# Patient Record
Sex: Male | Born: 1985 | Race: Black or African American | Hispanic: No | Marital: Married | Smoking: Never smoker
Health system: Southern US, Community
[De-identification: ages and names within clinical notes are randomized; demographics above are authoritative.]

---

## 2011-10-30 ENCOUNTER — Emergency Department (HOSPITAL_COMMUNITY): Payer: Self-pay

## 2011-10-30 ENCOUNTER — Encounter (HOSPITAL_COMMUNITY): Payer: Self-pay

## 2011-10-30 ENCOUNTER — Emergency Department (INDEPENDENT_AMBULATORY_CARE_PROVIDER_SITE_OTHER): Payer: Self-pay

## 2011-10-30 ENCOUNTER — Emergency Department (INDEPENDENT_AMBULATORY_CARE_PROVIDER_SITE_OTHER)
Admission: EM | Admit: 2011-10-30 | Discharge: 2011-10-30 | Disposition: A | Discharge status: Home / Self Care | Payer: Self-pay | Source: Home / Self Care | Attending: Emergency Medicine | Admitting: Emergency Medicine

## 2011-10-30 DIAGNOSIS — S9030XA Contusion of unspecified foot, initial encounter: Secondary | ICD-10-CM

## 2011-10-30 DIAGNOSIS — S1093XA Contusion of unspecified part of neck, initial encounter: Secondary | ICD-10-CM

## 2011-10-30 DIAGNOSIS — S0033XA Contusion of nose, initial encounter: Secondary | ICD-10-CM

## 2011-10-30 DIAGNOSIS — S0003XA Contusion of scalp, initial encounter: Secondary | ICD-10-CM

## 2011-10-30 MED ORDER — MELOXICAM 7.5 MG PO TABS: 7.5000 mg | ORAL_TABLET | Freq: Every day | ORAL | Status: AC
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
## 2013-05-27 ENCOUNTER — Emergency Department (INDEPENDENT_AMBULATORY_CARE_PROVIDER_SITE_OTHER): Payer: Self-pay

## 2013-05-27 ENCOUNTER — Encounter (HOSPITAL_COMMUNITY): Payer: Self-pay

## 2013-05-27 ENCOUNTER — Emergency Department (INDEPENDENT_AMBULATORY_CARE_PROVIDER_SITE_OTHER)
Admission: EM | Admit: 2013-05-27 | Discharge: 2013-05-27 | Disposition: A | Discharge status: Home / Self Care | Payer: Self-pay | Source: Home / Self Care | Attending: Emergency Medicine | Admitting: Emergency Medicine

## 2013-05-27 DIAGNOSIS — S7002XA Contusion of left hip, initial encounter: Secondary | ICD-10-CM

## 2013-05-27 DIAGNOSIS — S7000XA Contusion of unspecified hip, initial encounter: Secondary | ICD-10-CM

## 2013-05-27 MED ORDER — HYDROCODONE-ACETAMINOPHEN 5-325 MG PO TABS
ORAL_TABLET | ORAL | Status: DC
Start: 2013-05-27 — End: 2017-08-30

## 2013-05-27 MED ORDER — NAPROXEN 500 MG PO TABS: 500.0000 mg | ORAL_TABLET | Freq: Two times a day (BID) | ORAL | Status: DC
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
## 2016-08-30 ENCOUNTER — Ambulatory Visit (INDEPENDENT_AMBULATORY_CARE_PROVIDER_SITE_OTHER): Payer: Commercial Managed Care - HMO | Admitting: Family Medicine

## 2016-08-30 ENCOUNTER — Encounter: Payer: Self-pay | Admitting: Family Medicine

## 2016-08-30 DIAGNOSIS — H6981 Other specified disorders of Eustachian tube, right ear: Secondary | ICD-10-CM | POA: Diagnosis not present

## 2016-08-30 DIAGNOSIS — H6980 Other specified disorders of Eustachian tube, unspecified ear: Secondary | ICD-10-CM | POA: Insufficient documentation

## 2016-08-30 MED ORDER — FLUTICASONE PROPIONATE 50 MCG/ACT NA SUSP: NASAL | 1 refills | Status: DC

## 2016-08-30 NOTE — Assessment & Plan Note (Signed)
We'll place him on Flonase for a month. For the next week I would recommend he get some over-the-counter decongestant such as Sudafed and take one or 2 a day. If this does not clear up in the next 2-3 weeks he eats let me know. We briefly discussed sequela such as glue ear. He'll call if not improving.

## 2016-08-30 NOTE — Progress Notes (Signed)
    CHIEF COMPLAINT / HPI:   Right ear discomfort. For 2-3 days she's had some ear popping and in the last 24 hours he's has sensation of the ear being stuffed up in sounds seeming like he is under water. During this last 24 hours he's not had the ability to pop his ear. Pretty uncomfortable. He's never had this before. He's never had problem with wax before. He's had no other unusual symptoms like congestion, no sore throat, no fever sweats or chills.  Nonsmoker  REVIEW OF SYSTEMS:  See history of present illness  OBJECTIVE:  Vital signs are reviewed.   GEN.: Well-developed male no acute distress HEENT: Initially the TM on the right was her sleep a lot by cerumen. Once that was removed, the eardrum was seen to be quite retracted read, there was no cone of light or other identifiable landmarks and it was nonmobile. The left TM looked normal. Oropharynx is clear without a sign of exudate. He has no lymphadenopathy in the neck. Nasal mucosa is slightly boggy.    ASSESSMENT / PLAN: Please see problem oriented charting for details

## 2017-08-30 ENCOUNTER — Ambulatory Visit (HOSPITAL_COMMUNITY)
Admission: EM | Admit: 2017-08-30 | Discharge: 2017-08-30 | Disposition: A | Discharge status: Home / Self Care | Payer: 59 | Attending: Family Medicine | Admitting: Family Medicine

## 2017-08-30 ENCOUNTER — Encounter (HOSPITAL_COMMUNITY): Payer: Self-pay | Admitting: Emergency Medicine

## 2017-08-30 DIAGNOSIS — S80211A Abrasion, right knee, initial encounter: Secondary | ICD-10-CM | POA: Diagnosis not present

## 2017-08-30 DIAGNOSIS — Z23 Encounter for immunization: Secondary | ICD-10-CM | POA: Diagnosis not present

## 2017-08-30 DIAGNOSIS — T07XXXA Unspecified multiple injuries, initial encounter: Secondary | ICD-10-CM | POA: Diagnosis not present

## 2017-08-30 DIAGNOSIS — S40211A Abrasion of right shoulder, initial encounter: Secondary | ICD-10-CM

## 2017-08-30 MED ORDER — MUPIROCIN 2 % EX OINT: 1.0000 "application " | TOPICAL_OINTMENT | Freq: Three times a day (TID) | CUTANEOUS | 3 refills | Status: DC

## 2017-08-30 MED ORDER — TETANUS-DIPHTH-ACELL PERTUSSIS 5-2.5-18.5 LF-MCG/0.5 IM SUSP
INTRAMUSCULAR | Status: AC
  Filled 2017-08-30: qty 0.5

## 2017-08-30 MED ORDER — TETANUS-DIPHTH-ACELL PERTUSSIS 5-2.5-18.5 LF-MCG/0.5 IM SUSP
0.5000 mL | Freq: Once | INTRAMUSCULAR | Status: AC
  Administered 2017-08-30: 0.5 mL via INTRAMUSCULAR
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
## 2017-08-30 NOTE — Discharge Instructions (Signed)
Gently wash the abrasions several times a day and apply the ointment to the wounds.

## 2018-05-02 DIAGNOSIS — M9902 Segmental and somatic dysfunction of thoracic region: Secondary | ICD-10-CM | POA: Diagnosis not present

## 2018-05-02 DIAGNOSIS — M546 Pain in thoracic spine: Secondary | ICD-10-CM | POA: Diagnosis not present

## 2018-05-02 DIAGNOSIS — M9903 Segmental and somatic dysfunction of lumbar region: Secondary | ICD-10-CM | POA: Diagnosis not present

## 2018-05-06 DIAGNOSIS — M62830 Muscle spasm of back: Secondary | ICD-10-CM | POA: Diagnosis not present

## 2018-05-06 DIAGNOSIS — M9902 Segmental and somatic dysfunction of thoracic region: Secondary | ICD-10-CM | POA: Diagnosis not present

## 2018-05-06 DIAGNOSIS — M546 Pain in thoracic spine: Secondary | ICD-10-CM | POA: Diagnosis not present

## 2018-05-06 DIAGNOSIS — M545 Low back pain: Secondary | ICD-10-CM | POA: Diagnosis not present

## 2018-05-06 DIAGNOSIS — M9903 Segmental and somatic dysfunction of lumbar region: Secondary | ICD-10-CM | POA: Diagnosis not present

## 2018-05-10 DIAGNOSIS — M545 Low back pain: Secondary | ICD-10-CM | POA: Diagnosis not present

## 2018-05-10 DIAGNOSIS — M546 Pain in thoracic spine: Secondary | ICD-10-CM | POA: Diagnosis not present

## 2018-05-10 DIAGNOSIS — M9902 Segmental and somatic dysfunction of thoracic region: Secondary | ICD-10-CM | POA: Diagnosis not present

## 2018-05-10 DIAGNOSIS — M9903 Segmental and somatic dysfunction of lumbar region: Secondary | ICD-10-CM | POA: Diagnosis not present

## 2018-05-10 DIAGNOSIS — M62830 Muscle spasm of back: Secondary | ICD-10-CM | POA: Diagnosis not present

## 2018-05-13 DIAGNOSIS — M546 Pain in thoracic spine: Secondary | ICD-10-CM | POA: Diagnosis not present

## 2018-05-13 DIAGNOSIS — M62830 Muscle spasm of back: Secondary | ICD-10-CM | POA: Diagnosis not present

## 2018-05-13 DIAGNOSIS — M9903 Segmental and somatic dysfunction of lumbar region: Secondary | ICD-10-CM | POA: Diagnosis not present

## 2018-05-13 DIAGNOSIS — M545 Low back pain: Secondary | ICD-10-CM | POA: Diagnosis not present

## 2018-05-13 DIAGNOSIS — M9902 Segmental and somatic dysfunction of thoracic region: Secondary | ICD-10-CM | POA: Diagnosis not present

## 2018-05-15 DIAGNOSIS — M545 Low back pain: Secondary | ICD-10-CM | POA: Diagnosis not present

## 2018-05-15 DIAGNOSIS — M546 Pain in thoracic spine: Secondary | ICD-10-CM | POA: Diagnosis not present

## 2018-05-15 DIAGNOSIS — M62830 Muscle spasm of back: Secondary | ICD-10-CM | POA: Diagnosis not present

## 2018-05-15 DIAGNOSIS — M9902 Segmental and somatic dysfunction of thoracic region: Secondary | ICD-10-CM | POA: Diagnosis not present

## 2018-05-15 DIAGNOSIS — M9903 Segmental and somatic dysfunction of lumbar region: Secondary | ICD-10-CM | POA: Diagnosis not present

## 2018-05-22 DIAGNOSIS — M546 Pain in thoracic spine: Secondary | ICD-10-CM | POA: Diagnosis not present

## 2018-05-22 DIAGNOSIS — M545 Low back pain: Secondary | ICD-10-CM | POA: Diagnosis not present

## 2018-05-22 DIAGNOSIS — M9902 Segmental and somatic dysfunction of thoracic region: Secondary | ICD-10-CM | POA: Diagnosis not present

## 2018-05-22 DIAGNOSIS — M9903 Segmental and somatic dysfunction of lumbar region: Secondary | ICD-10-CM | POA: Diagnosis not present

## 2018-05-22 DIAGNOSIS — M62830 Muscle spasm of back: Secondary | ICD-10-CM | POA: Diagnosis not present

## 2018-05-27 DIAGNOSIS — M546 Pain in thoracic spine: Secondary | ICD-10-CM | POA: Diagnosis not present

## 2018-05-27 DIAGNOSIS — M545 Low back pain: Secondary | ICD-10-CM | POA: Diagnosis not present

## 2018-05-27 DIAGNOSIS — M62830 Muscle spasm of back: Secondary | ICD-10-CM | POA: Diagnosis not present

## 2018-05-27 DIAGNOSIS — M9902 Segmental and somatic dysfunction of thoracic region: Secondary | ICD-10-CM | POA: Diagnosis not present

## 2018-05-27 DIAGNOSIS — M9903 Segmental and somatic dysfunction of lumbar region: Secondary | ICD-10-CM | POA: Diagnosis not present

## 2018-05-29 DIAGNOSIS — M9903 Segmental and somatic dysfunction of lumbar region: Secondary | ICD-10-CM | POA: Diagnosis not present

## 2018-05-29 DIAGNOSIS — M546 Pain in thoracic spine: Secondary | ICD-10-CM | POA: Diagnosis not present

## 2018-05-29 DIAGNOSIS — M9902 Segmental and somatic dysfunction of thoracic region: Secondary | ICD-10-CM | POA: Diagnosis not present

## 2018-05-29 DIAGNOSIS — M545 Low back pain: Secondary | ICD-10-CM | POA: Diagnosis not present

## 2018-05-29 DIAGNOSIS — M62830 Muscle spasm of back: Secondary | ICD-10-CM | POA: Diagnosis not present

## 2018-05-31 DIAGNOSIS — M9903 Segmental and somatic dysfunction of lumbar region: Secondary | ICD-10-CM | POA: Diagnosis not present

## 2018-05-31 DIAGNOSIS — M546 Pain in thoracic spine: Secondary | ICD-10-CM | POA: Diagnosis not present

## 2018-05-31 DIAGNOSIS — M9902 Segmental and somatic dysfunction of thoracic region: Secondary | ICD-10-CM | POA: Diagnosis not present

## 2018-05-31 DIAGNOSIS — M62830 Muscle spasm of back: Secondary | ICD-10-CM | POA: Diagnosis not present

## 2018-05-31 DIAGNOSIS — M545 Low back pain: Secondary | ICD-10-CM | POA: Diagnosis not present

## 2018-06-03 DIAGNOSIS — M62830 Muscle spasm of back: Secondary | ICD-10-CM | POA: Diagnosis not present

## 2018-06-03 DIAGNOSIS — M9903 Segmental and somatic dysfunction of lumbar region: Secondary | ICD-10-CM | POA: Diagnosis not present

## 2018-06-03 DIAGNOSIS — M9902 Segmental and somatic dysfunction of thoracic region: Secondary | ICD-10-CM | POA: Diagnosis not present

## 2018-06-03 DIAGNOSIS — M546 Pain in thoracic spine: Secondary | ICD-10-CM | POA: Diagnosis not present

## 2018-06-03 DIAGNOSIS — M545 Low back pain: Secondary | ICD-10-CM | POA: Diagnosis not present

## 2018-07-26 DIAGNOSIS — M9902 Segmental and somatic dysfunction of thoracic region: Secondary | ICD-10-CM | POA: Diagnosis not present

## 2018-07-26 DIAGNOSIS — M545 Low back pain: Secondary | ICD-10-CM | POA: Diagnosis not present

## 2018-07-26 DIAGNOSIS — M9903 Segmental and somatic dysfunction of lumbar region: Secondary | ICD-10-CM | POA: Diagnosis not present

## 2018-07-26 DIAGNOSIS — M546 Pain in thoracic spine: Secondary | ICD-10-CM | POA: Diagnosis not present

## 2018-07-26 DIAGNOSIS — M62830 Muscle spasm of back: Secondary | ICD-10-CM | POA: Diagnosis not present

## 2019-01-03 ENCOUNTER — Other Ambulatory Visit: Payer: Self-pay | Admitting: Family Medicine

## 2019-01-03 MED ORDER — OSELTAMIVIR PHOSPHATE 75 MG PO CAPS: 75.0000 mg | ORAL_CAPSULE | Freq: Every day | ORAL | 0 refills | Status: AC

## 2019-01-03 NOTE — Progress Notes (Signed)
Son dx w influenza will px family 

## 2020-09-16 ENCOUNTER — Ambulatory Visit (INDEPENDENT_AMBULATORY_CARE_PROVIDER_SITE_OTHER): Payer: 59 | Admitting: *Deleted

## 2020-09-16 ENCOUNTER — Other Ambulatory Visit: Payer: Self-pay

## 2020-09-16 DIAGNOSIS — Z23 Encounter for immunization: Secondary | ICD-10-CM | POA: Diagnosis not present

## 2020-09-16 NOTE — Progress Notes (Signed)
   [facility]  Name:  Ryan Marshall.    MRN: [id number] DOB: 1986-05-19  09/16/2020  Ryan Marshall was observed post Covid-19 immunization for 15 minutes without incident. He was provided with Vaccine Information Sheet and instruction to access the V-Safe system.   Ryan Marshall was instructed to call 911 with any severe reactions post vaccine: Marland Kitchen Difficulty breathing  . Swelling of face and throat  . A fast heartbeat  . A bad rash all over body  . Dizziness and weakness

## 2020-09-20 ENCOUNTER — Other Ambulatory Visit: Payer: 59

## 2020-09-20 DIAGNOSIS — Z20822 Contact with and (suspected) exposure to covid-19: Secondary | ICD-10-CM

## 2020-09-22 LAB — SARS-COV-2, NAA 2 DAY TAT

## 2020-09-22 LAB — NOVEL CORONAVIRUS, NAA: SARS-CoV-2, NAA: DETECTED — AB

## 2020-09-23 ENCOUNTER — Other Ambulatory Visit: Payer: Self-pay | Admitting: Family

## 2020-09-23 ENCOUNTER — Telehealth: Payer: Self-pay | Admitting: Family

## 2020-09-23 ENCOUNTER — Encounter: Payer: Self-pay | Admitting: Oncology

## 2020-09-23 ENCOUNTER — Telehealth: Payer: Self-pay | Admitting: Oncology

## 2020-09-23 ENCOUNTER — Encounter: Payer: Self-pay | Admitting: Family Medicine

## 2020-09-23 ENCOUNTER — Ambulatory Visit (HOSPITAL_COMMUNITY): Payer: 59

## 2020-09-23 DIAGNOSIS — Z6839 Body mass index (BMI) 39.0-39.9, adult: Secondary | ICD-10-CM

## 2020-09-23 DIAGNOSIS — U071 COVID-19: Secondary | ICD-10-CM

## 2020-09-23 DIAGNOSIS — Z609 Problem related to social environment, unspecified: Secondary | ICD-10-CM

## 2020-09-23 NOTE — Progress Notes (Signed)
I connected by phone with Linda Hedges. on 09/23/2020 at 3:52 PM to discuss the potential use of a new treatment for mild to moderate COVID-19 viral infection in non-hospitalized patients.  This patient is a 34 y.o. male that meets the FDA criteria for Emergency Use Authorization of COVID monoclonal antibody casirivimab/imdevimab or bamlanivimab/eteseviamb.  Has a (+) direct SARS-CoV-2 viral test result  Has mild or moderate COVID-19   Is NOT hospitalized due to COVID-19  Is within 10 days of symptom onset  Has at least one of the high risk factor(s) for progression to severe COVID-19 and/or hospitalization as defined in EUA.  Specific high risk criteria : BMI > 25 and Other high risk medical condition per CDC:  High SVI   I have spoken and communicated the following to the patient or parent/caregiver regarding COVID monoclonal antibody treatment:  1. FDA has authorized the emergency use for the treatment of mild to moderate COVID-19 in adults and pediatric patients with positive results of direct SARS-CoV-2 viral testing who are 88 years of age and older weighing at least 40 kg, and who are at high risk for progressing to severe COVID-19 and/or hospitalization.  2. The significant known and potential risks and benefits of COVID monoclonal antibody, and the extent to which such potential risks and benefits are unknown.  3. Information on available alternative treatments and the risks and benefits of those alternatives, including clinical trials.  4. Patients treated with COVID monoclonal antibody should continue to self-isolate and use infection control measures (e.g., wear mask, isolate, social distance, avoid sharing personal items, clean and disinfect "high touch" surfaces, and frequent handwashing) according to CDC guidelines.   5. The patient or parent/caregiver has the option to accept or refuse COVID monoclonal antibody treatment.  After reviewing this information with the  patient, the patient has agreed to receive one of the available covid 19 monoclonal antibodies and will be provided an appropriate fact sheet prior to infusion.   Jeanine Luz, FNP 09/23/2020 3:52 PM

## 2020-09-23 NOTE — Telephone Encounter (Signed)
.  Re: [facility]  Called to Discuss with patient about Covid symptoms and the use of regeneron, a monoclonal antibody infusion for those with mild to moderate Covid symptoms and at a high risk of hospitalization.     Pt is qualified for this infusion at the [facility] center due to co-morbid conditions and/or a member of an at-risk group.    No past medical history on file.  Specific risk condition-obesity/race   Unable to reach pt. Left VM and MCM.  Mignon Pine, AGNP-C [phone] (Infusion Center Hotline)

## 2020-09-23 NOTE — Telephone Encounter (Signed)
Called to Discuss with patient about Covid symptoms and the use of the monoclonal antibody infusion for those with mild to moderate Covid symptoms and at a high risk of hospitalization.     Pt appears to qualify for this infusion due to co-morbid conditions and/or a member of an at-risk group in accordance with the FDA Emergency Use Authorization.    Ryan Marshall's symptoms started on 9/25 with current symptoms being congestion. Tested positive on 9/27. Risk factors include BMI >25 and high SVI.  Spoke with Ryan Marshall regarding the risks and benefits of treatment with Regeneron. He wishes to proceed with treatment.   Hello Ryan Marshall.,   You have been scheduled to receive Regeneron (the monoclonal antibody we discussed) on : 09/24/20 at 10:30am  If you have been tested outside of a [facility] - you MUST bring a copy of your positive test with you the morning of your appointment. You may take a photo of this and upload to your MyChart portal or have the testing facility fax the result to [phone]    The address for the infusion clinic site is:  --GPS address is [address] - the parking is located near [employer] building where you will see  COVID19 Infusion feather banner marking the entrance to parking.   (see photos below)            --Enter into the 2nd entrance where the "wave, flag banner" is at the road. Turn into this 2nd entrance and immediately turn left to park in 1 of the 5 parking spots.   --Please stay in your car and call the desk for assistance inside [phone].   --Average time in department is roughly 2 hours for Regeneron treatment - this includes preparation of the medication, IV start and the required 1 hour monitoring after the infusion.    Should you develop worsening shortness of breath, chest pain or severe breathing problems please do not wait for this appointment and go to the Emergency room for evaluation and treatment. You will  undergo another oxygen screen before your infusion to ensure this is the best treatment option for you. There is a chance that the best decision may be to send you to the Emergency Room for evaluation at the time of your appointment.   The day of your visit you should: Marland Kitchen Get plenty of rest the night before and drink plenty of water . Eat a light meal/snack before coming and take your medications as prescribed  . Wear warm, comfortable clothes with a shirt that can roll-up over the elbow (will need IV start).  . Wear a mask  . Consider bringing some activity to help pass the time  Many commercial insurers are waiving bills related to COVID treatment however some have ranged from $300-640. We are starting to see some insurers send bills to patients later for the administration of the medication - we are learning more information but you may receive a bill after your appointment.  Please contact your insurance agent to discuss prior to your appointment if you would like further details about billing specific to your policy.    The CPT code is [phone] for your reference.   Marcos Eke, NP 09/23/2020 3:51 PM

## 2020-09-23 NOTE — Progress Notes (Signed)
COVID positive. Received first dose Pfizer 09/16/2020 and started to have symptoms last couple of days. Tested at [facility] and was positive. Would like to get monoclonal ab so I will set that up. Risk factors: Obesity, race.

## 2020-09-24 ENCOUNTER — Ambulatory Visit (HOSPITAL_COMMUNITY)
Admission: RE | Admit: 2020-09-24 | Discharge: 2020-09-24 | Disposition: A | Discharge status: Home / Self Care | Payer: 59 | Source: Ambulatory Visit | Attending: Pulmonary Disease | Admitting: Pulmonary Disease

## 2020-09-24 DIAGNOSIS — Z609 Problem related to social environment, unspecified: Secondary | ICD-10-CM

## 2020-09-24 DIAGNOSIS — U071 COVID-19: Secondary | ICD-10-CM | POA: Diagnosis present

## 2020-09-24 DIAGNOSIS — Z6839 Body mass index (BMI) 39.0-39.9, adult: Secondary | ICD-10-CM | POA: Diagnosis present

## 2020-09-24 MED ORDER — SODIUM CHLORIDE 0.9 % IV SOLN
1200.0000 mg | Freq: Once | INTRAVENOUS | Status: AC
  Administered 2020-09-24: 1200 mg via INTRAVENOUS

## 2020-09-24 MED ORDER — METHYLPREDNISOLONE SODIUM SUCC 125 MG IJ SOLR: 125.0000 mg | Freq: Once | INTRAMUSCULAR | Status: DC | PRN

## 2020-09-24 MED ORDER — ALBUTEROL SULFATE HFA 108 (90 BASE) MCG/ACT IN AERS: 2.0000 | INHALATION_SPRAY | Freq: Once | RESPIRATORY_TRACT | Status: DC | PRN

## 2020-09-24 MED ORDER — FAMOTIDINE IN NACL 20-0.9 MG/50ML-% IV SOLN: 20.0000 mg | Freq: Once | INTRAVENOUS | Status: DC | PRN

## 2020-09-24 MED ORDER — DIPHENHYDRAMINE HCL 50 MG/ML IJ SOLN: 50.0000 mg | Freq: Once | INTRAMUSCULAR | Status: DC | PRN

## 2020-09-24 MED ORDER — SODIUM CHLORIDE 0.9 % IV SOLN: INTRAVENOUS | Status: DC | PRN

## 2020-09-24 MED ORDER — EPINEPHRINE 0.3 MG/0.3ML IJ SOAJ: 0.3000 mg | Freq: Once | INTRAMUSCULAR | Status: DC | PRN

## 2020-09-24 NOTE — Discharge Instructions (Signed)

## 2020-09-24 NOTE — Progress Notes (Signed)
  Diagnosis: COVID-19  Physician: Dr. Wright  Procedure: Covid Infusion Clinic Med: casirivimab\imdevimab infusion - Provided patient with casirivimab\imdevimab fact sheet for patients, parents and caregivers prior to infusion.  Complications: No immediate complications noted.  Discharge: Discharged home   Dmari Schubring J Tsuruko Murtha 09/24/2020  

## 2020-10-01 ENCOUNTER — Other Ambulatory Visit: Payer: 59

## 2020-10-04 ENCOUNTER — Other Ambulatory Visit: Payer: 59

## 2020-10-04 DIAGNOSIS — Z20822 Contact with and (suspected) exposure to covid-19: Secondary | ICD-10-CM

## 2020-10-05 LAB — NOVEL CORONAVIRUS, NAA: SARS-CoV-2, NAA: NOT DETECTED

## 2020-10-05 LAB — SARS-COV-2, NAA 2 DAY TAT

## 2020-10-07 ENCOUNTER — Ambulatory Visit: Payer: 59

## 2020-10-25 ENCOUNTER — Other Ambulatory Visit: Payer: 59

## 2020-10-25 DIAGNOSIS — Z20822 Contact with and (suspected) exposure to covid-19: Secondary | ICD-10-CM

## 2020-10-26 LAB — NOVEL CORONAVIRUS, NAA: SARS-CoV-2, NAA: NOT DETECTED

## 2020-10-26 LAB — SARS-COV-2, NAA 2 DAY TAT

## 2020-11-01 ENCOUNTER — Other Ambulatory Visit: Payer: 59

## 2020-11-01 DIAGNOSIS — Z20822 Contact with and (suspected) exposure to covid-19: Secondary | ICD-10-CM

## 2020-11-02 LAB — NOVEL CORONAVIRUS, NAA: SARS-CoV-2, NAA: NOT DETECTED

## 2020-11-02 LAB — SARS-COV-2, NAA 2 DAY TAT

## 2020-11-08 ENCOUNTER — Other Ambulatory Visit: Payer: 59

## 2020-11-08 DIAGNOSIS — Z20822 Contact with and (suspected) exposure to covid-19: Secondary | ICD-10-CM

## 2020-11-09 LAB — SARS-COV-2, NAA 2 DAY TAT

## 2020-11-09 LAB — NOVEL CORONAVIRUS, NAA: SARS-CoV-2, NAA: NOT DETECTED

## 2020-11-15 ENCOUNTER — Other Ambulatory Visit: Payer: 59

## 2020-11-15 DIAGNOSIS — Z20822 Contact with and (suspected) exposure to covid-19: Secondary | ICD-10-CM

## 2020-11-16 LAB — SARS-COV-2, NAA 2 DAY TAT

## 2020-11-16 LAB — NOVEL CORONAVIRUS, NAA: SARS-CoV-2, NAA: NOT DETECTED

## 2020-11-22 ENCOUNTER — Other Ambulatory Visit: Payer: 59

## 2020-11-22 DIAGNOSIS — Z20822 Contact with and (suspected) exposure to covid-19: Secondary | ICD-10-CM

## 2020-11-24 LAB — SARS-COV-2, NAA 2 DAY TAT

## 2020-11-24 LAB — NOVEL CORONAVIRUS, NAA: SARS-CoV-2, NAA: NOT DETECTED

## 2020-11-29 ENCOUNTER — Other Ambulatory Visit: Payer: 59

## 2020-11-29 DIAGNOSIS — Z20822 Contact with and (suspected) exposure to covid-19: Secondary | ICD-10-CM

## 2020-12-02 LAB — NOVEL CORONAVIRUS, NAA: SARS-CoV-2, NAA: NOT DETECTED

## 2020-12-06 ENCOUNTER — Other Ambulatory Visit: Payer: 59

## 2020-12-06 DIAGNOSIS — Z20822 Contact with and (suspected) exposure to covid-19: Secondary | ICD-10-CM

## 2020-12-08 LAB — NOVEL CORONAVIRUS, NAA: SARS-CoV-2, NAA: NOT DETECTED

## 2020-12-08 LAB — SARS-COV-2, NAA 2 DAY TAT

## 2020-12-13 ENCOUNTER — Other Ambulatory Visit: Payer: 59

## 2020-12-13 DIAGNOSIS — Z20822 Contact with and (suspected) exposure to covid-19: Secondary | ICD-10-CM

## 2020-12-15 LAB — SARS-COV-2, NAA 2 DAY TAT

## 2020-12-15 LAB — NOVEL CORONAVIRUS, NAA: SARS-CoV-2, NAA: NOT DETECTED

## 2020-12-20 ENCOUNTER — Ambulatory Visit (INDEPENDENT_AMBULATORY_CARE_PROVIDER_SITE_OTHER): Payer: 59 | Admitting: *Deleted

## 2020-12-20 ENCOUNTER — Other Ambulatory Visit: Payer: Self-pay

## 2020-12-20 DIAGNOSIS — Z23 Encounter for immunization: Secondary | ICD-10-CM | POA: Diagnosis not present

## 2020-12-22 ENCOUNTER — Ambulatory Visit: Payer: 59

## 2021-01-03 ENCOUNTER — Other Ambulatory Visit: Payer: 59

## 2021-01-12 ENCOUNTER — Other Ambulatory Visit: Payer: 59

## 2021-01-12 DIAGNOSIS — Z20822 Contact with and (suspected) exposure to covid-19: Secondary | ICD-10-CM

## 2021-01-14 LAB — NOVEL CORONAVIRUS, NAA: SARS-CoV-2, NAA: DETECTED — AB

## 2021-01-14 LAB — SARS-COV-2, NAA 2 DAY TAT

## 2021-10-19 ENCOUNTER — Ambulatory Visit (INDEPENDENT_AMBULATORY_CARE_PROVIDER_SITE_OTHER): Payer: 59 | Admitting: Family Medicine

## 2021-10-19 ENCOUNTER — Encounter: Payer: Self-pay | Admitting: Family Medicine

## 2021-10-19 ENCOUNTER — Other Ambulatory Visit: Payer: Self-pay

## 2021-10-19 VITALS — BP 130/75 | HR 60 | Ht 74.0 in | Wt 252.8 lb

## 2021-10-19 DIAGNOSIS — Z0000 Encounter for general adult medical examination without abnormal findings: Secondary | ICD-10-CM

## 2021-10-19 DIAGNOSIS — Z3009 Encounter for other general counseling and advice on contraception: Secondary | ICD-10-CM | POA: Diagnosis not present

## 2021-10-19 DIAGNOSIS — R0683 Snoring: Secondary | ICD-10-CM

## 2021-10-19 NOTE — Patient Instructions (Signed)
I will send you a note about your blood work.  We are looking at your cholesterol and basic metabolic panel.    I have put into referrals for you and you should hear from each of them within the next 10 days.  1 is to urology 1 is to pulmonology for the evaluation for sleep study.  It was great to see you today!  I would recommend to get your eyes checked including eye pressure sometime in the next couple of years.  I would see you back for regular physical in 1 to 2 years, certainly sooner if you have any new issues.

## 2021-10-19 NOTE — Assessment & Plan Note (Signed)
We will check lipid panel and CMP today.  He wants to defer screening for HIV or hepatitis C to another time.  Referral to pulmonology for evaluation for sleep study and referral to urology for evaluation possible vasectomy.  He gets his flu shot at work.  He has had the COVID vaccine series.  We discussed red flags for early detection of colon cancer.  I would recommend screening at 45 unless he had symptoms and we reviewed those today. Discussed exercise, weight lifting and cardiovascular fitness.  I will see him in 1 to 2 years.
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
## 2021-10-20 LAB — COMPREHENSIVE METABOLIC PANEL
ALT: 17 IU/L (ref 0–44)
AST: 26 IU/L (ref 0–40)
Albumin/Globulin Ratio: 2.9 — ABNORMAL HIGH (ref 1.2–2.2)
Albumin: 4.6 g/dL (ref 4.0–5.0)
Alkaline Phosphatase: 53 IU/L (ref 44–121)
BUN/Creatinine Ratio: 11 (ref 9–20)
BUN: 12 mg/dL (ref 6–20)
Bilirubin Total: 1.3 mg/dL — ABNORMAL HIGH (ref 0.0–1.2)
CO2: 24 mmol/L (ref 20–29)
Calcium: 9 mg/dL (ref 8.7–10.2)
Chloride: 104 mmol/L (ref 96–106)
Creatinine, Ser: 1.05 mg/dL (ref 0.76–1.27)
Globulin, Total: 1.6 g/dL (ref 1.5–4.5)
Glucose: 87 mg/dL (ref 70–99)
Potassium: 4.4 mmol/L (ref 3.5–5.2)
Sodium: 141 mmol/L (ref 134–144)
Total Protein: 6.2 g/dL (ref 6.0–8.5)
eGFR: 95 mL/min/{1.73_m2} (ref >59)

## 2021-10-20 LAB — LIPID PANEL
Chol/HDL Ratio: 2.8 ratio (ref 0.0–5.0)
Cholesterol, Total: 163 mg/dL (ref 100–199)
HDL: 59 mg/dL (ref >39)
LDL Chol Calc (NIH): 93 mg/dL (ref 0–99)
Triglycerides: 56 mg/dL (ref 0–149)
VLDL Cholesterol Cal: 11 mg/dL (ref 5–40)

## 2021-10-26 ENCOUNTER — Encounter: Payer: Self-pay | Admitting: Family Medicine

## 2021-10-26 NOTE — Progress Notes (Signed)
Letter sent - labs normal

## 2021-12-01 ENCOUNTER — Other Ambulatory Visit (HOSPITAL_COMMUNITY): Payer: Self-pay

## 2021-12-01 MED ORDER — DIAZEPAM 10 MG PO TABS
10.0000 mg | ORAL_TABLET | ORAL | 0 refills | Status: DC
  Filled 2021-12-01: qty 1, 1d supply, fill #0

## 2022-01-27 ENCOUNTER — Other Ambulatory Visit (HOSPITAL_COMMUNITY): Payer: Self-pay

## 2022-01-27 MED ORDER — HYDROCODONE-ACETAMINOPHEN 5-325 MG PO TABS
1.0000 | ORAL_TABLET | ORAL | 0 refills | Status: DC | PRN
  Filled 2022-01-27: qty 10, 2d supply, fill #0

## 2022-03-06 ENCOUNTER — Other Ambulatory Visit: Payer: Self-pay

## 2022-03-06 ENCOUNTER — Ambulatory Visit: Payer: 59 | Admitting: Family Medicine

## 2022-03-06 DIAGNOSIS — S83512A Sprain of anterior cruciate ligament of left knee, initial encounter: Secondary | ICD-10-CM | POA: Diagnosis not present

## 2022-03-06 DIAGNOSIS — M25462 Effusion, left knee: Secondary | ICD-10-CM | POA: Diagnosis not present

## 2022-03-06 DIAGNOSIS — M2392 Unspecified internal derangement of left knee: Secondary | ICD-10-CM | POA: Diagnosis not present

## 2022-03-07 ENCOUNTER — Ambulatory Visit
Admission: RE | Admit: 2022-03-07 | Discharge: 2022-03-07 | Disposition: A | Discharge status: Home / Self Care | Payer: 59 | Source: Ambulatory Visit | Attending: Family Medicine | Admitting: Family Medicine

## 2022-03-07 ENCOUNTER — Other Ambulatory Visit (HOSPITAL_COMMUNITY): Payer: Self-pay

## 2022-03-07 ENCOUNTER — Encounter: Payer: Self-pay | Admitting: Family Medicine

## 2022-03-07 DIAGNOSIS — M2392 Unspecified internal derangement of left knee: Secondary | ICD-10-CM

## 2022-03-07 DIAGNOSIS — S83519A Sprain of anterior cruciate ligament of unspecified knee, initial encounter: Secondary | ICD-10-CM | POA: Insufficient documentation

## 2022-03-07 DIAGNOSIS — S83512A Sprain of anterior cruciate ligament of left knee, initial encounter: Secondary | ICD-10-CM

## 2022-03-07 DIAGNOSIS — M25462 Effusion, left knee: Secondary | ICD-10-CM | POA: Insufficient documentation

## 2022-03-07 MED ORDER — DIAZEPAM 10 MG PO TABS
10.0000 mg | ORAL_TABLET | ORAL | 0 refills | Status: DC
  Filled 2022-03-07: qty 2, 1d supply, fill #0

## 2022-03-07 NOTE — Assessment & Plan Note (Signed)
Suspect left ACL tear ?Likely has meniscal injury as well given the symptoms of giving way and falling, sensation of knee "shiftiong" with attempt at pivot ?Discussed options ?MRI would be most definitive test ?He is going to consider as cost may be an issue ?Do not think [place] or plain X ray would be that beneficial but will  Get if required for authorization of MRI ?He will let me kow his decision ?

## 2022-03-07 NOTE — Progress Notes (Signed)
? ? ?  CHIEF COMPLAINT / HPI: ? Left knee pain, swelling and giving way. Has had problems off and on with it for a while. Ryan Marshall recently 2 months ago had flair up where its welled and he had significant pain. He rested and it got a bot better. On Saturday (03/04/2022) he slid down off a wall where he was sitting and landed on some cushions which shifted with his weight. He felt immediate pop and pain in left knee. Felt like the knee gave way.Unable to initially bear weight. Swelled as large as a grapefruit (he took pics). Unable to bear weight yesterday and early today. Has been able to bear some wight this afternoon but still very painful. Elevated and ICED knee and swelling improved a bit. Ibuprofen has helped pain. ? ? ?PERTINENT  PMH / PSH: I have reviewed the patient?s medications, allergies, past medical and surgical history, smoking status and updated in the EMR as appropriate. ? ? ?OBJECTIVE: ? There were no vitals taken for this visit. ?GEN WD WN NAD ?GAIT antalgic favoring left knee and walking stiff legged ?KNEE: left knee has moderate effusion. No defect noted in Quad tendon or patellar tendon. Can fully extend but extremely painful last 20 degrees. Lacks full flexion by 20 degrees, Medial joint line ttp. Lateral joint line nontender. Poplteal space somewhat full. Difficult to assess anterior drawer and lachman secondary to effusion but there is subtle difference with more perceived laxity on LEFT c/w right. Thessaly and [employer] positive on left.Left Patellar grind mildly painful. No patellar apprehension. ?NV: distally he is neurovascularly intact. Calf is soft. ? ?ASSESSMENT / PLAN: ? ? ?ACL injury tear ?Suspect left ACL tear ?Likely has meniscal injury as well given the symptoms of giving way and falling, sensation of knee "shiftiong" with attempt at pivot ?Discussed options ?MRI would be most definitive test ?He is going to consider as cost may be an issue ?Do not think [place] or plain X ray would be that  beneficial but will  Get if required for authorization of MRI ?He will let me kow his decision ? ?Knee effusion, left ?Was quite large on date of most recent injur ?Has started to decrease in size ?Doubt benefit of aspiration at this time ?continue ICE#, WBAT with emphasis on only doing absolutely necessary ADLs. Consider compression brace ?Will call if does not continue to improve or is worse.  ?Denny Levy MD ?

## 2022-03-07 NOTE — Assessment & Plan Note (Signed)
Was quite large on date of most recent injur ?Has started to decrease in size ?Doubt benefit of aspiration at this time ?continue ICE#, WBAT with emphasis on only doing absolutely necessary ADLs. Consider compression brace ?Will call if does not continue to improve or is worse. ?

## 2022-03-08 ENCOUNTER — Other Ambulatory Visit (HOSPITAL_COMMUNITY): Payer: Self-pay

## 2022-03-08 ENCOUNTER — Other Ambulatory Visit: Payer: Self-pay | Admitting: Family Medicine

## 2022-03-08 DIAGNOSIS — S83512A Sprain of anterior cruciate ligament of left knee, initial encounter: Secondary | ICD-10-CM

## 2022-03-08 MED ORDER — IBUPROFEN 800 MG PO TABS
800.0000 mg | ORAL_TABLET | Freq: Three times a day (TID) | ORAL | 1 refills | Status: DC | PRN
  Filled 2022-03-08: qty 90, 30d supply, fill #0

## 2022-05-30 ENCOUNTER — Encounter: Payer: Self-pay | Admitting: *Deleted

## 2022-07-26 ENCOUNTER — Other Ambulatory Visit (HOSPITAL_COMMUNITY): Payer: Self-pay

## 2022-07-26 MED ORDER — TIZANIDINE HCL 4 MG PO TABS
4.0000 mg | ORAL_TABLET | Freq: Three times a day (TID) | ORAL | 0 refills | Status: DC | PRN
  Filled 2022-07-26: qty 20, 7d supply, fill #0

## 2022-07-26 MED ORDER — OXYCODONE-ACETAMINOPHEN 5-325 MG PO TABS
1.0000 | ORAL_TABLET | Freq: Four times a day (QID) | ORAL | 0 refills | Status: DC | PRN
  Filled 2022-07-26: qty 11, 2d supply, fill #0
  Filled 2022-07-26: qty 14, 3d supply, fill #0
  Filled 2022-07-26: qty 11, 1d supply, fill #0
  Filled 2022-07-26: qty 14, 3d supply, fill #0

## 2022-10-15 IMAGING — MR MR KNEE*L* W/O CM
7 series · 40 of 40 positions shown · non-contrast
Comparison: None.

CLINICAL DATA: Meniscal injury, knee. Left anterior knee pain when
trying to pivot for 2 months, and notes right leg weakness when
standing and going up stairs makes the pain worse. Clinical concern
for internal derangement.

EXAM:
MRI OF THE LEFT KNEE WITHOUT CONTRAST
TECHNIQUE: Multiplanar, multisequence MR imaging of the knee was performed. No
intravenous contrast was administered.

[Series 6: T2 fat-sat · axial · left · 4.0mm · 0.50mm/px · z∈[-56,+96]mm · 8 of 36 slices shown (1 of 3)]
[im 1/36]
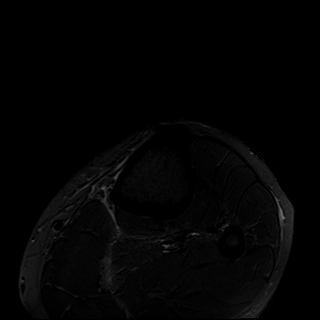
[im 6/36]
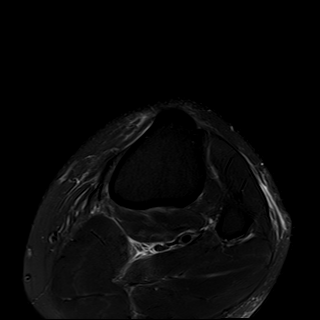
[im 11/36]
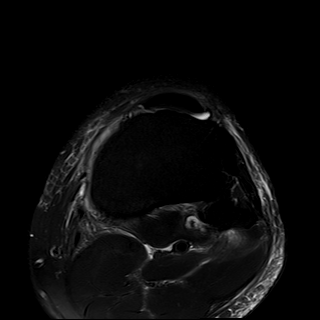
[im 16/36]
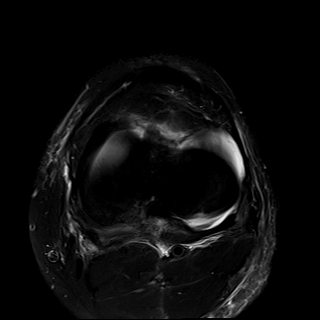
[im 21/36]
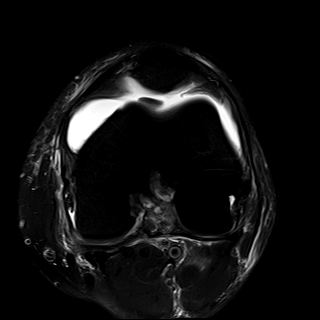
[im 26/36]
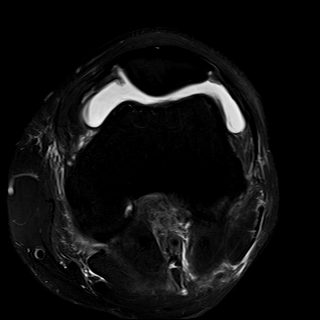
[im 31/36]
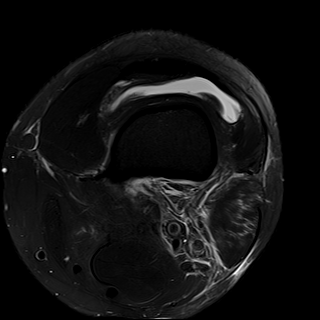
[im 36/36]
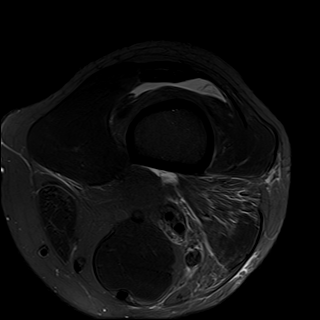

[Series 7: T2 fat-sat · coronal · left · 4.0mm · 0.47mm/px · 5 of 27 slices shown (2 of 3)]
[im 1/27]
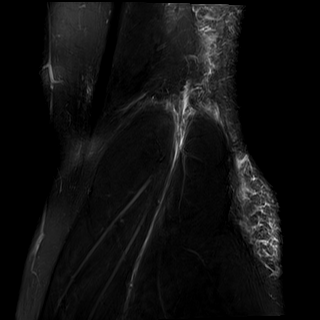
[im 7/27]
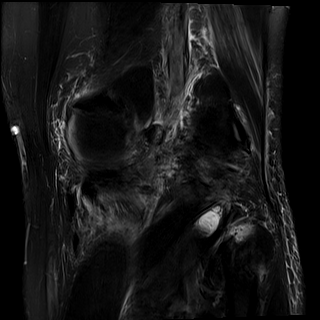
[im 14/27]
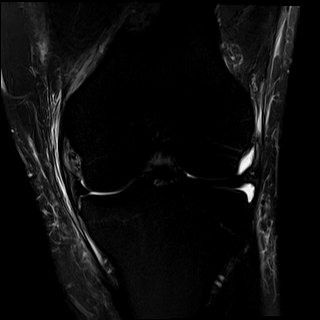
[im 20/27]
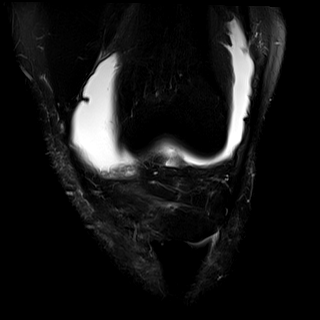
[im 27/27]
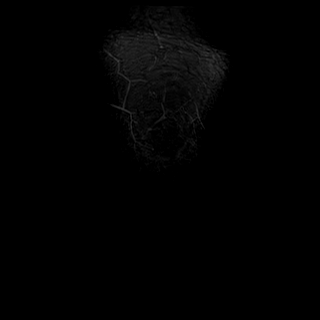

[Series 8: T1 · coronal · left · 4.0mm · 0.47mm/px · 5 of 27 slices shown]
[im 1/27]
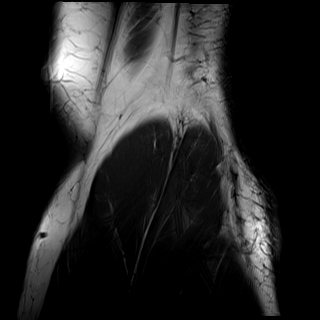
[im 7/27]
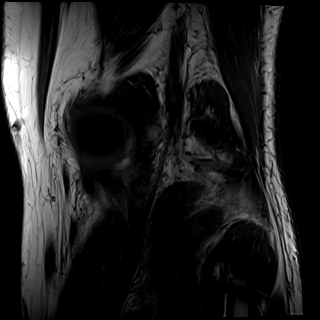
[im 14/27]
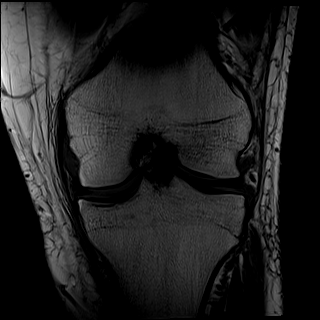
[im 20/27]
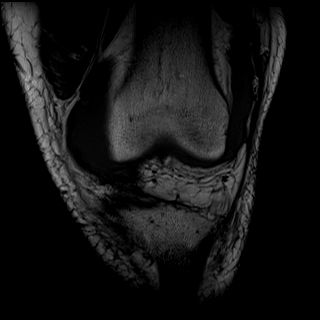
[im 27/27]
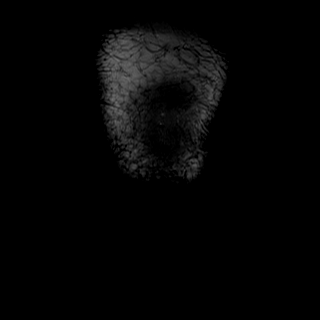

[Series 9: PD fat-sat · coronal · left · 3.0mm · 0.47mm/px · 6 of 33 slices shown (1 of 2)]
[im 1/33]
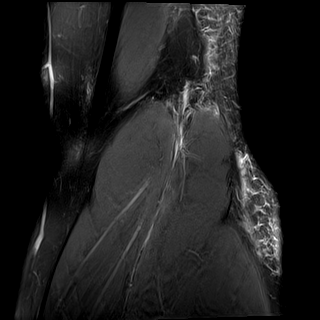
[im 7/33]
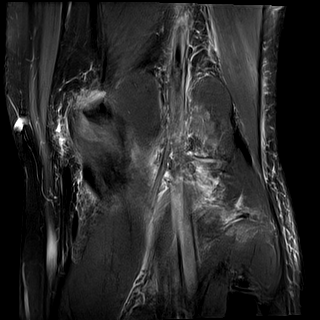
[im 13/33]
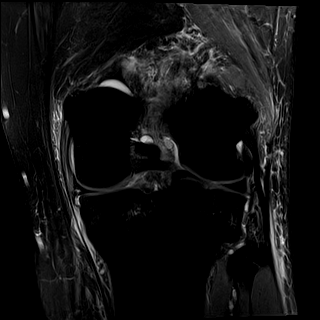
[im 20/33]
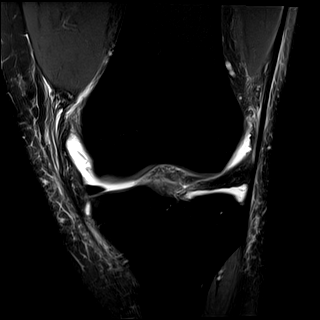
[im 26/33]
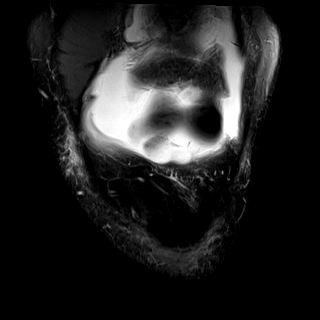
[im 33/33]
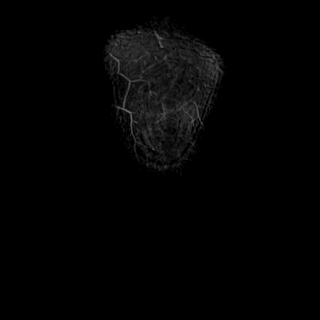

[Series 10: PD fat-sat · sagittal · left · 3.0mm · 0.47mm/px · 6 of 30 slices shown (2 of 2)]
[im 1/30]
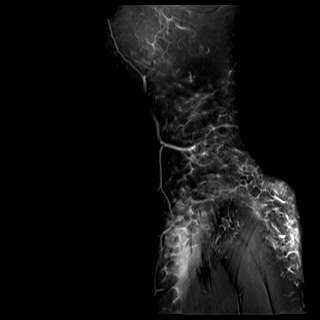
[im 6/30]
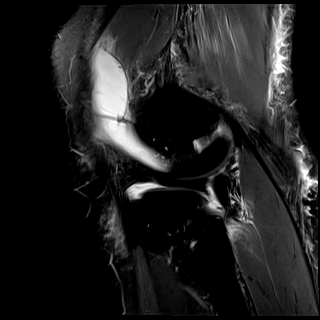
[im 12/30]
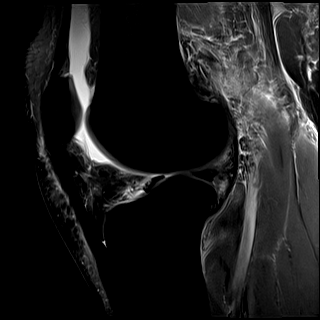
[im 18/30]
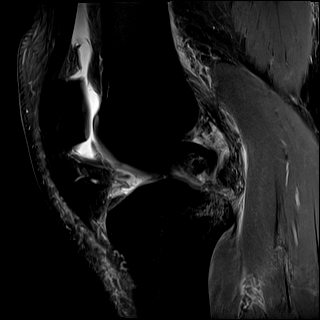
[im 24/30]
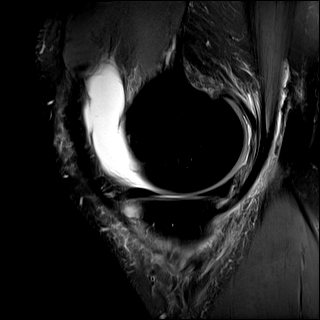
[im 30/30]
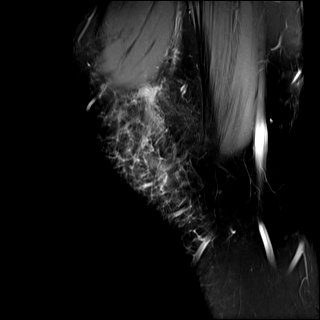

[Series 11: T2 fat-sat · sagittal · left · 3.0mm · 0.47mm/px · 6 of 30 slices shown (3 of 3)]
[im 1/30]
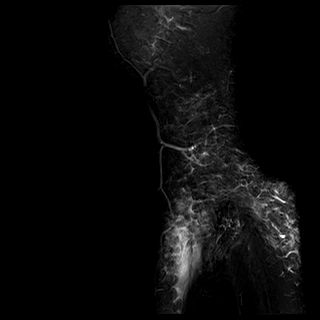
[im 6/30]
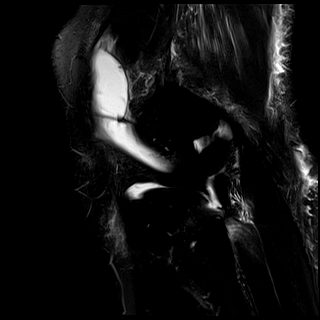
[im 12/30]
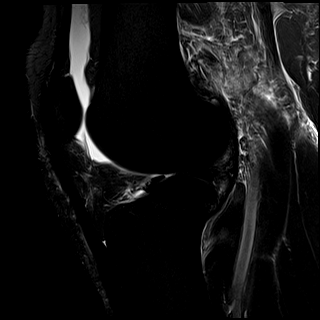
[im 18/30]
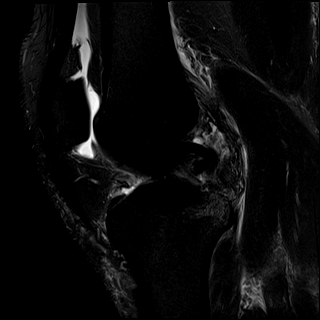
[im 24/30]
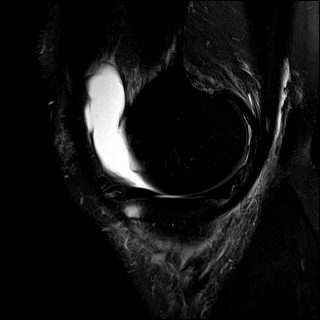
[im 30/30]
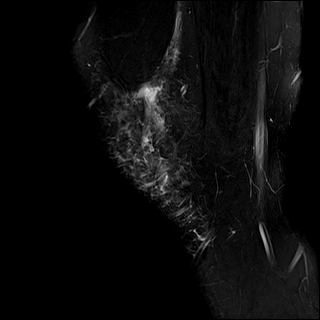

[Series 12: PD · axial · left · 1.5mm · 0.44mm/px · z∈[-40,-14]mm · 4 of 21 slices shown]
[im 1/21]
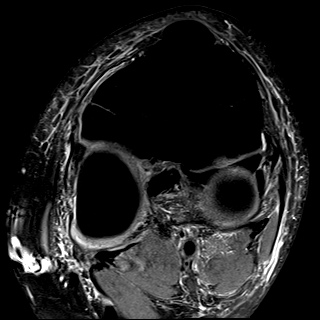
[im 7/21]
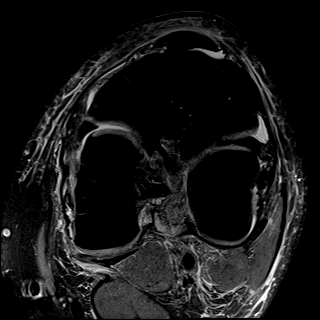
[im 14/21]
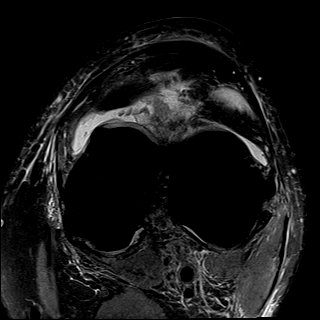
[im 21/21]
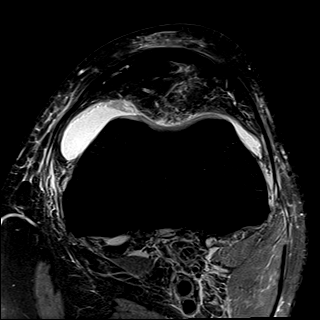

[40 of 40 positions shown; findings below may reference images not displayed]

FINDINGS: MENISCI

Medial meniscus: There is intermediate proton density signal
intrasubstance degeneration within the root of the posterior horn of
the medial meniscus (coronal image 11 and sagittal images 12 and
13), with a focal complex tear noted. There is horizontal linear
fluid bright signal within the slightly inferior aspect of the mid
to peripheral portion of the posterior segment of the body of the
medial meniscus (coronal images 12 through 14) extending through the
medial wall but not an articular surface of the medial meniscus.

Lateral meniscus:  Intact.

LIGAMENTS

Cruciates: There appears to be increased T2 signal and thickening of
a partial-thickness tear of the superior ACL. The PCL is intact.
There is moderate marrow edema within the posterior aspect of the
lateral tibial plateau. No marrow edema is seen within the terminal
sulcus of the lateral femoral condyle to complete an "acute
pivot-shift marrow contusion pattern" seen with the mechanism of
recent ACL injury, however the posterolateral tibial plateau bone
marrow contusion may be related to the ACL injury being acute.

Collaterals: The medial collateral ligament is intact. The fibular
collateral ligament, biceps femoris tendon, iliotibial band, and
popliteus tendon are intact. There is mild cystic change at the deep
aspect of the popliteus musculotendinous junction probable ganglion.

CARTILAGE

Patellofemoral:  Intact.

Medial:  Intact.

Lateral: Mild surface irregularity of the posterior lateral tibial
plateau and posterior weight-bearing lateral femoral condyle
cartilage including focal high-grade partial-thickness cartilage
loss within the weight-bearing lateral femoral condyle on coronal
images tendon 11 of series 7.

Joint: Moderatejoint effusion. Normal Hoffa's fat pad. No plical
thickening.

Popliteal Fossa:  No Baker's cyst.

Extensor Mechanism:  Intact quadriceps tendon and patellar tendon.

Bones:  No acute fracture or dislocation.

Other: There is focal edema within the proximal lateral aspect of
the soleus muscle (axial image 25), likely muscle strain.
IMPRESSION: :
IMPRESSION: 1. Superior ACL at least partial-thickness tear. Marrow edema within
the posterolateral tibial plateau, likely associated marrow
contusion from recent ACL injury.
2. Focal complex tear of the root of the posterior horn of the
medial meniscus. Posteromedial tibial plateau bone marrow contusion.
3. Focal high-grade partial-thickness cartilage loss within the
weight-bearing lateral femoral condyle.
4. Moderate joint effusion.

## 2022-10-30 ENCOUNTER — Encounter: Payer: Self-pay | Admitting: Sports Medicine

## 2022-10-30 ENCOUNTER — Ambulatory Visit (INDEPENDENT_AMBULATORY_CARE_PROVIDER_SITE_OTHER): Payer: 59 | Admitting: Sports Medicine

## 2022-10-30 VITALS — BP 136/79 | HR 67 | Wt 261.0 lb

## 2022-10-30 DIAGNOSIS — R718 Other abnormality of red blood cells: Secondary | ICD-10-CM | POA: Diagnosis not present

## 2022-10-30 DIAGNOSIS — E669 Obesity, unspecified: Secondary | ICD-10-CM | POA: Diagnosis not present

## 2022-10-30 DIAGNOSIS — Z0000 Encounter for general adult medical examination without abnormal findings: Secondary | ICD-10-CM | POA: Diagnosis not present

## 2022-10-30 MED ORDER — WEGOVY 0.25 MG/0.5ML ~~LOC~~ SOAJ: 0.2500 mg | SUBCUTANEOUS | 0 refills | Status: DC

## 2022-10-30 NOTE — Assessment & Plan Note (Addendum)
Up-to-date on screening measures, declines flu shot. I went ahead and coded today as an annual physical

## 2022-10-30 NOTE — Assessment & Plan Note (Signed)
This is a very pleasant 36 year old male, I see his wife and we have been helping her with weight loss, he would also like to work on weight loss. GLP-1's have been tremendously effective for his wife, so we will start with [facility]. He will be part of a multidisciplinary weight loss program with calorie counting, I did give him an exercise prescription, he will start when he is cleared by his orthopedic surgeon, he is post ACL reconstruction. If he is unable to find low-dose Wegovy we can certainly try the compounded semaglutide [facility] [place], the other option is he can use some of his wife's leftover Saxenda. I would like to see him a couple of months after he starts the injections to evaluate tolerance and weight loss.

## 2022-10-30 NOTE — Progress Notes (Signed)
Subjective:    CC: Annual Physical Exam  HPI:  This patient is here for their annual physical  I reviewed the past medical history, family history, social history, surgical history, and allergies today and no changes were needed.  Please see the problem list section below in epic for further details.  Past Medical History: No past medical history on file. Past Surgical History: No past surgical history on file. Social History: Social History   Socioeconomic History   Marital status: Married    Spouse name: Not on file   Number of children: Not on file   Years of education: Not on file   Highest education level: Not on file  Occupational History   Not on file  Tobacco Use   Smoking status: Never   Smokeless tobacco: Never  Substance and Sexual Activity   Alcohol use: Yes   Drug use: Not on file   Sexual activity: Not on file  Other Topics Concern   Not on file  Social History Narrative   Not on file   Social Determinants of Health   Financial Resource Strain: Not on file  Food Insecurity: Not on file  Transportation Needs: Not on file  Physical Activity: Not on file  Stress: Not on file  Social Connections: Not on file   Family History: No family history on file. Allergies: No Known Allergies Medications: See med rec.  Review of Systems: No headache, visual changes, nausea, vomiting, diarrhea, constipation, dizziness, abdominal pain, skin rash, fevers, chills, night sweats, swollen lymph nodes, weight loss, chest pain, body aches, joint swelling, muscle aches, shortness of breath, mood changes, visual or auditory hallucinations.  Objective:    General: Well Developed, well nourished, and in no acute distress.  Neuro: Alert and oriented x3, extra-ocular muscles intact, sensation grossly intact. Cranial nerves II through XII are intact, motor, sensory, and coordinative functions are all intact. HEENT: Normocephalic, atraumatic, pupils equal round reactive to  light, neck supple, no masses, no lymphadenopathy, thyroid nonpalpable. Oropharynx, nasopharynx, external ear canals are unremarkable. Skin: Warm and dry, no rashes noted.  Cardiac: Regular rate and rhythm, no murmurs rubs or gallops.  Respiratory: Clear to auscultation bilaterally. Not using accessory muscles, speaking in full sentences.  Abdominal: Soft, nontender, nondistended, positive bowel sounds, no masses, no organomegaly.  Musculoskeletal: Shoulder, elbow, wrist, hip, knee, ankle stable, and with full range of motion.  Impression and Recommendations:    The patient was counselled, risk factors were discussed, anticipatory guidance given.  Obesity (BMI 30-39.9) This is a very pleasant 36 year old male, I see his wife and we have been helping her with weight loss, he would also like to work on weight loss. GLP-1's have been tremendously effective for his wife, so we will start with [facility]. He will be part of a multidisciplinary weight loss program with calorie counting, I did give him an exercise prescription, he will start when he is cleared by his orthopedic surgeon, he is post ACL reconstruction. If he is unable to find low-dose Wegovy we can certainly try the compounded [facility]aglutide [facility] in [place], the other option is he can use some of his wife's leftover Saxenda. I would like to see him a couple of months after he starts the injections to evaluate tolerance and weight loss.  Well adult exam Up-to-date on screening measures, declines flu shot. 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   ____________________________________________ Ihor Austin. Benjamin Stain, M.D., ABFM., CAQSM., AME. Primary Care and Sports Medicine [facility]  Adjunct Professor of Family Medicine   [employer]  Restaurant manager, fast food

## 2022-10-31 ENCOUNTER — Other Ambulatory Visit (HOSPITAL_COMMUNITY): Payer: Self-pay

## 2022-10-31 ENCOUNTER — Encounter: Payer: Self-pay | Admitting: Sports Medicine

## 2022-10-31 DIAGNOSIS — E669 Obesity, unspecified: Secondary | ICD-10-CM

## 2022-10-31 DIAGNOSIS — R718 Other abnormality of red blood cells: Secondary | ICD-10-CM | POA: Insufficient documentation

## 2022-10-31 DIAGNOSIS — D582 Other hemoglobinopathies: Secondary | ICD-10-CM | POA: Insufficient documentation

## 2022-10-31 LAB — COMPLETE METABOLIC PANEL WITH GFR
AG Ratio: 2 (calc) (ref 1.0–2.5)
ALT: 25 U/L (ref 9–46)
AST: 30 U/L (ref 10–40)
Albumin: 4.7 g/dL (ref 3.6–5.1)
Alkaline phosphatase (APISO): 56 U/L (ref 36–130)
BUN: 13 mg/dL (ref 7–25)
CO2: 31 mmol/L (ref 20–32)
Calcium: 9.3 mg/dL (ref 8.6–10.3)
Chloride: 103 mmol/L (ref 98–110)
Creat: 0.97 mg/dL (ref 0.60–1.26)
Globulin: 2.3 g/dL (calc) (ref 1.9–3.7)
Glucose, Bld: 93 mg/dL (ref 65–139)
Potassium: 4 mmol/L (ref 3.5–5.3)
Sodium: 140 mmol/L (ref 135–146)
Total Bilirubin: 1.3 mg/dL — ABNORMAL HIGH (ref 0.2–1.2)
Total Protein: 7 g/dL (ref 6.1–8.1)
eGFR: 104 mL/min/{1.73_m2} (ref >=60)

## 2022-10-31 LAB — LIPID PANEL
Cholesterol: 166 mg/dL (ref <200)
HDL: 65 mg/dL (ref >=40)
LDL Cholesterol (Calc): 77 mg/dL (calc)
Non-HDL Cholesterol (Calc): 101 mg/dL (calc) (ref <130)
Total CHOL/HDL Ratio: 2.6 (calc) (ref <5.0)
Triglycerides: 137 mg/dL (ref <150)

## 2022-10-31 LAB — CBC
HCT: 41.5 % (ref 38.5–50.0)
Hemoglobin: 14 g/dL (ref 13.2–17.1)
MCH: 25.7 pg — ABNORMAL LOW (ref 27.0–33.0)
MCHC: 33.7 g/dL (ref 32.0–36.0)
MCV: 76.1 fL — ABNORMAL LOW (ref 80.0–100.0)
MPV: 9.9 fL (ref 7.5–12.5)
Platelets: 227 10*3/uL (ref 140–400)
RBC: 5.45 10*6/uL (ref 4.20–5.80)
RDW: 14.1 % (ref 11.0–15.0)
WBC: 5.4 10*3/uL (ref 3.8–10.8)

## 2022-10-31 LAB — HEMOGLOBIN A1C
Hgb A1c MFr Bld: 5.6 % of total Hgb (ref <5.7)
Mean Plasma Glucose: 114 mg/dL
eAG (mmol/L): 6.3 mmol/L

## 2022-10-31 LAB — TSH: TSH: 1.25 mIU/L (ref 0.40–4.50)

## 2022-10-31 MED ORDER — WEGOVY 0.25 MG/0.5ML ~~LOC~~ SOAJ
0.2500 mg | SUBCUTANEOUS | 0 refills | Status: DC
  Filled 2022-10-31: qty 2, 28d supply, fill #0

## 2022-10-31 NOTE — Addendum Note (Signed)
Addended by: Silverio Decamp on: 10/31/2022 08:34 AM   Modules accepted: Orders

## 2022-10-31 NOTE — Assessment & Plan Note (Signed)
Per patient report, awaiting hemoglobin electrophoresis.

## 2022-10-31 NOTE — Assessment & Plan Note (Signed)
Incidentally noted isolated RBC microcytosis without anemia, suspect thalassemia, adding hemoglobinopathy evaluation.

## 2022-11-05 MED ORDER — NOVOFINE PEN NEEDLE 32G X 6 MM MISC
3 refills | Status: DC
  Filled 2022-11-05 – 2022-11-15 (×2): qty 100, 30d supply, fill #0
  Filled 2023-03-04: qty 100, 30d supply, fill #1

## 2022-11-05 MED ORDER — SAXENDA 18 MG/3ML ~~LOC~~ SOPN
3.0000 mg | PEN_INJECTOR | Freq: Every day | SUBCUTANEOUS | 11 refills | Status: DC
  Filled 2022-11-05 – 2022-11-15 (×3): qty 15, 30d supply, fill #0
  Filled 2022-12-23: qty 15, 30d supply, fill #1
  Filled 2023-03-04: qty 15, 30d supply, fill #2

## 2022-11-05 NOTE — Addendum Note (Signed)
Addended by: Monica Becton on: 11/05/2022 02:00 PM   Modules accepted: Orders

## 2022-11-06 ENCOUNTER — Other Ambulatory Visit (HOSPITAL_COMMUNITY): Payer: Self-pay

## 2022-11-14 ENCOUNTER — Other Ambulatory Visit: Payer: Self-pay | Admitting: Sports Medicine

## 2022-11-14 ENCOUNTER — Telehealth: Payer: Self-pay

## 2022-11-14 ENCOUNTER — Other Ambulatory Visit (HOSPITAL_COMMUNITY): Payer: Self-pay

## 2022-11-14 DIAGNOSIS — E669 Obesity, unspecified: Secondary | ICD-10-CM

## 2022-11-14 NOTE — Telephone Encounter (Signed)
Hi Keyairea, did my reply to him sound reasonable?

## 2022-11-14 NOTE — Telephone Encounter (Signed)
Initiated Prior authorization HWY:SHUOHF 0.25MG /0.5ML auto-injectors Via: Covermymeds Case/Key:[id number] Status: approved  as of 11/14/22 Reason:approved through 03/02/2023 Notified Pt via: Mychart

## 2022-11-15 ENCOUNTER — Other Ambulatory Visit (HOSPITAL_COMMUNITY): Payer: Self-pay

## 2022-11-15 NOTE — Telephone Encounter (Addendum)
Initiated Prior authorization YOK:HTXHFSF 18MG /3ML pen-injectors Via: Covermymeds Case/Key:[id number] Status: approved  as of 11/15/22 Reason:approved through 03/16/2023 Notified Pt via: Mychart

## 2023-03-15 ENCOUNTER — Encounter (INDEPENDENT_AMBULATORY_CARE_PROVIDER_SITE_OTHER): Payer: 59 | Admitting: Sports Medicine

## 2023-03-15 ENCOUNTER — Other Ambulatory Visit (HOSPITAL_COMMUNITY): Payer: Self-pay

## 2023-03-15 DIAGNOSIS — E669 Obesity, unspecified: Secondary | ICD-10-CM

## 2023-03-16 ENCOUNTER — Other Ambulatory Visit: Payer: Self-pay

## 2023-03-16 ENCOUNTER — Other Ambulatory Visit (HOSPITAL_COMMUNITY): Payer: Self-pay

## 2023-03-16 ENCOUNTER — Encounter (HOSPITAL_COMMUNITY): Payer: Self-pay

## 2023-03-16 MED ORDER — WEGOVY 0.25 MG/0.5ML ~~LOC~~ SOAJ: 0.2500 mg | SUBCUTANEOUS | 0 refills | Status: DC

## 2023-03-16 NOTE — Telephone Encounter (Signed)
I spent 5 total minutes of online digital evaluation and management services in this patient-initiated request for online care. 

## 2023-03-18 ENCOUNTER — Other Ambulatory Visit: Payer: Self-pay | Admitting: Sports Medicine

## 2023-03-18 DIAGNOSIS — E669 Obesity, unspecified: Secondary | ICD-10-CM

## 2023-03-21 ENCOUNTER — Other Ambulatory Visit: Payer: Self-pay

## 2023-03-26 ENCOUNTER — Other Ambulatory Visit: Payer: Self-pay | Admitting: Sports Medicine

## 2023-03-26 ENCOUNTER — Other Ambulatory Visit (HOSPITAL_COMMUNITY): Payer: Self-pay

## 2023-03-26 DIAGNOSIS — E669 Obesity, unspecified: Secondary | ICD-10-CM

## 2023-03-26 MED ORDER — WEGOVY 2.4 MG/0.75ML ~~LOC~~ SOAJ
2.4000 mg | SUBCUTANEOUS | 3 refills | Status: AC
  Filled 2023-03-26: qty 3, 28d supply, fill #0
  Filled 2023-05-09: qty 3, 28d supply, fill #1
  Filled 2023-06-07: qty 3, 28d supply, fill #2
  Filled 2023-07-16: qty 3, 28d supply, fill #3
  Filled 2023-08-29: qty 3, 28d supply, fill #4
  Filled 2023-10-02: qty 3, 28d supply, fill #5
  Filled 2023-11-11: qty 3, 28d supply, fill #6

## 2023-03-26 NOTE — Assessment & Plan Note (Signed)
Belmin has been doing well, historically in [place], he called in [place], it got approved but he still on Saxenda, he would like to switch to [place], he is on max dose Saxenda so we will transition to full dose Wegovy.

## 2023-03-29 ENCOUNTER — Other Ambulatory Visit (HOSPITAL_COMMUNITY): Payer: Self-pay

## 2023-04-05 ENCOUNTER — Other Ambulatory Visit (HOSPITAL_COMMUNITY): Payer: Self-pay

## 2023-04-06 ENCOUNTER — Other Ambulatory Visit: Payer: Self-pay

## 2023-04-13 ENCOUNTER — Other Ambulatory Visit (HOSPITAL_COMMUNITY): Payer: Self-pay

## 2023-04-13 ENCOUNTER — Telehealth: Payer: Self-pay

## 2023-04-13 NOTE — Telephone Encounter (Addendum)
Initiated Prior authorization ZOX:WRUEAV 2.4MG /0.5ML auto-injectors Via: Covermymeds Case/Key:[id number] Status: approved  as of 04/13/23 Reason:Authorization Expiration Date: April 12, 2024. Notified Pt via: Mychart

## 2023-04-16 ENCOUNTER — Other Ambulatory Visit (HOSPITAL_COMMUNITY): Payer: Self-pay

## 2023-04-19 ENCOUNTER — Other Ambulatory Visit (HOSPITAL_COMMUNITY): Payer: Self-pay

## 2023-04-20 ENCOUNTER — Other Ambulatory Visit (HOSPITAL_COMMUNITY): Payer: Self-pay

## 2023-04-22 ENCOUNTER — Other Ambulatory Visit (HOSPITAL_COMMUNITY): Payer: Self-pay

## 2023-04-23 ENCOUNTER — Other Ambulatory Visit (HOSPITAL_COMMUNITY): Payer: Self-pay

## 2023-05-03 ENCOUNTER — Other Ambulatory Visit (HOSPITAL_COMMUNITY): Payer: Self-pay

## 2023-05-09 ENCOUNTER — Other Ambulatory Visit: Payer: Self-pay

## 2023-06-08 ENCOUNTER — Other Ambulatory Visit: Payer: Self-pay

## 2024-08-25 ENCOUNTER — Emergency Department (HOSPITAL_BASED_OUTPATIENT_CLINIC_OR_DEPARTMENT_OTHER)

## 2024-08-25 ENCOUNTER — Encounter (HOSPITAL_BASED_OUTPATIENT_CLINIC_OR_DEPARTMENT_OTHER): Payer: Self-pay

## 2024-08-25 ENCOUNTER — Other Ambulatory Visit (HOSPITAL_COMMUNITY): Payer: Self-pay

## 2024-08-25 ENCOUNTER — Other Ambulatory Visit: Payer: Self-pay

## 2024-08-25 ENCOUNTER — Emergency Department (HOSPITAL_BASED_OUTPATIENT_CLINIC_OR_DEPARTMENT_OTHER)
Admission: EM | Admit: 2024-08-25 | Discharge: 2024-08-25 | Disposition: A | Discharge status: Home / Self Care | Attending: Emergency Medicine | Admitting: Emergency Medicine

## 2024-08-25 DIAGNOSIS — M791 Myalgia, unspecified site: Secondary | ICD-10-CM

## 2024-08-25 DIAGNOSIS — R519 Headache, unspecified: Secondary | ICD-10-CM | POA: Diagnosis present

## 2024-08-25 DIAGNOSIS — Y92410 Unspecified street and highway as the place of occurrence of the external cause: Secondary | ICD-10-CM | POA: Diagnosis not present

## 2024-08-25 DIAGNOSIS — M7918 Myalgia, other site: Secondary | ICD-10-CM | POA: Diagnosis not present

## 2024-08-25 MED ORDER — METHOCARBAMOL 750 MG PO TABS
750.0000 mg | ORAL_TABLET | Freq: Three times a day (TID) | ORAL | 0 refills | Status: AC | PRN
  Filled 2024-08-25: qty 15, 5d supply, fill #0

## 2024-08-25 MED ORDER — IBUPROFEN 400 MG PO TABS
400.0000 mg | ORAL_TABLET | Freq: Once | ORAL | Status: AC
  Administered 2024-08-25: 400 mg via ORAL
  Filled 2024-08-25: qty 1

## 2024-08-25 MED ORDER — ACETAMINOPHEN 500 MG PO TABS
1000.0000 mg | ORAL_TABLET | Freq: Once | ORAL | Status: AC
  Administered 2024-08-25: 1000 mg via ORAL
  Filled 2024-08-25: qty 2

## 2024-08-25 MED ORDER — METHOCARBAMOL 750 MG PO TABS: 750.0000 mg | ORAL_TABLET | Freq: Three times a day (TID) | ORAL | 0 refills | Status: DC | PRN

## 2024-08-25 NOTE — Discharge Instructions (Addendum)
 It was our pleasure to provide your ER care today - we hope that you feel better.  Take acetaminophen  or ibuprofen  as need for pain. You may also take robaxin  as need for muscle pain/spasm - no driving when taking.   Follow up with primary care doctor in 1-2 weeks if symptoms fail to improve/resolve.  Return to ER if worse, new symptoms, new/severe pain, trouble breathing, abdominal pain, numbness/weakness,  or other emergency concern.

## 2024-08-25 NOTE — ED Notes (Signed)
 DC paperwork given and verbally understood.... Pt DC d before assessment could be performed.SABRASABRA
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
## 2024-08-26 ENCOUNTER — Encounter: Payer: Self-pay | Admitting: Sports Medicine

## 2024-11-10 ENCOUNTER — Telehealth: Payer: Self-pay

## 2024-11-10 NOTE — Telephone Encounter (Signed)
 He has appt with Dr. Tanda in EMR on 11/21 they should be able to see chart!

## 2024-11-10 NOTE — Telephone Encounter (Signed)
 Copied from CRM #[id number]. Topic: Appointments - Transfer of Care >> Nov 10, 2024  4:01 PM Joesph B wrote: Pt is requesting to transfer FROM: Dr.T Pt is requesting to transfer TO: Dr.wilson Reason for requested transfer: Dr no longer active It is the responsibility of the team the patient would like to transfer to (Dr. Tanda) to reach out to the patient if for any reason this transfer is not acceptable.

## 2024-11-10 NOTE — Telephone Encounter (Signed)
 Forwarding message to Vermell Bologna, [place] covering Dr. Curtis.   O.k. to send referral? I will need to contact patient to clarity which provider and what facility is requesting this referral be sent to  as well before sending

## 2024-11-14 ENCOUNTER — Ambulatory Visit: Admitting: Family Medicine

## 2024-11-14 VITALS — BP 132/79 | HR 59 | Ht 74.0 in | Wt 230.6 lb

## 2024-11-14 DIAGNOSIS — R52 Pain, unspecified: Secondary | ICD-10-CM

## 2024-11-14 DIAGNOSIS — Z23 Encounter for immunization: Secondary | ICD-10-CM

## 2024-11-14 DIAGNOSIS — F4310 Post-traumatic stress disorder, unspecified: Secondary | ICD-10-CM | POA: Diagnosis not present

## 2024-11-14 DIAGNOSIS — Z7689 Persons encountering health services in other specified circumstances: Secondary | ICD-10-CM
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
## 2024-11-18 ENCOUNTER — Encounter: Payer: Self-pay | Admitting: Family Medicine

## 2024-12-12 ENCOUNTER — Ambulatory Visit: Admitting: Family Medicine

## 2024-12-12 ENCOUNTER — Other Ambulatory Visit (HOSPITAL_COMMUNITY): Payer: Self-pay

## 2024-12-12 ENCOUNTER — Encounter: Payer: Self-pay | Admitting: Family Medicine

## 2024-12-12 VITALS — BP 141/95 | HR 67 | Ht 74.0 in | Wt 225.4 lb

## 2024-12-12 DIAGNOSIS — I10 Essential (primary) hypertension: Secondary | ICD-10-CM | POA: Diagnosis not present

## 2024-12-12 MED ORDER — HYDROCHLOROTHIAZIDE 12.5 MG PO CAPS
12.5000 mg | ORAL_CAPSULE | Freq: Every day | ORAL | 0 refills | Status: AC
  Filled 2024-12-12: qty 30, 30d supply, fill #0
  Filled 2025-01-07: qty 30, 30d supply, fill #1

## 2024-12-12 NOTE — Progress Notes (Unsigned)
 "  Established Patient Office Visit  Subjective    Patient ID: Ryan Leib., male    DOB: 1986-07-21  Age: 38 y.o. MRN: [id number]  CC:  Chief Complaint  Patient presents with   Medical Management of Chronic Issues    HPI Ryan Marshall. presents for follow up of elevated blood pressure. Patient has not yet started an agent and denies acute complaints.   Outpatient Encounter Medications as of 12/12/2024  Medication Sig   hydrochlorothiazide  (MICROZIDE ) 12.5 MG capsule Take 1 capsule (12.5 mg total) by mouth daily.   methocarbamol  (ROBAXIN ) 750 MG tablet Take 1 tablet (750 mg total) by mouth 3 (three) times daily as needed (muscle spasm/pain).   WEGOVY  2.4 MG/0.75ML SOAJ Inject 2.4 mg into the skin once a week. (Patient not taking: Reported on 12/12/2024)   No facility-administered encounter medications on file as of 12/12/2024.    History reviewed. No pertinent past medical history.  History reviewed. No pertinent surgical history.  History reviewed. No pertinent family history.  Social History   Socioeconomic History   Marital status: Married    Spouse name: Not on file   Number of children: Not on file   Years of education: Not on file   Highest education level: Bachelor's degree (e.g., BA, AB, BS)  Occupational History   Not on file  Tobacco Use   Smoking status: Never   Smokeless tobacco: Never  Substance and Sexual Activity   Alcohol use: Yes   Drug use: Not on file   Sexual activity: Not on file  Other Topics Concern   Not on file  Social History Narrative   Not on file   Social Drivers of Health   Tobacco Use: Low Risk (12/12/2024)   Patient History    Smoking Tobacco Use: Never    Smokeless Tobacco Use: Never    Passive Exposure: Not on file  Financial Resource Strain: Low Risk (11/14/2024)   Overall Financial Resource Strain (CARDIA)    Difficulty of Paying Living Expenses: Not very hard  Food Insecurity: No Food Insecurity (11/14/2024)   Epic     Worried About Programme Researcher, Broadcasting/film/video in the Last Year: Never true    Ran Out of Food in the Last Year: Never true  Transportation Needs: No Transportation Needs (11/14/2024)   Epic    Lack of Transportation (Medical): No    Lack of Transportation (Non-Medical): No  Physical Activity: Insufficiently Active (11/14/2024)   Exercise Vital Sign    Days of Exercise per Week: 4 days    Minutes of Exercise per Session: 30 min  Stress: Stress Concern Present (11/14/2024)   [employer] of Occupational Health - Occupational Stress Questionnaire    Feeling of Stress: Rather much  Social Connections: Moderately Isolated (11/14/2024)   Social Connection and Isolation Panel    Frequency of Communication with Friends and Family: Once a week    Frequency of Social Gatherings with Friends and Family: Once a week    Attends Religious Services: 1 to 4 times per year    Active Member of [employer] or Organizations: No    Attends Banker Meetings: Not on file    Marital Status: Married  Intimate Partner Violence: Not At Risk (11/14/2024)   Epic    Fear of Current or Ex-Partner: No    Emotionally Abused: No    Physically Abused: No    Sexually Abused: No  Depression (PHQ2-9): Medium Risk (11/14/2024)   Depression (PHQ2-9)  PHQ-2 Score: 6  Alcohol Screen: Low Risk (11/14/2024)   Alcohol Screen    Last Alcohol Screening Score (AUDIT): 2  Housing: Low Risk (11/14/2024)   Epic    Unable to Pay for Housing in the Last Year: No    Number of Times Moved in the Last Year: 0    Homeless in the Last Year: No  Utilities: Not At Risk (11/14/2024)   Epic    Threatened with loss of utilities: No  Health Literacy: Adequate Health Literacy (11/14/2024)   B1300 Health Literacy    Frequency of need for help with medical instructions: Never    Review of Systems  All other systems reviewed and are negative.       Objective    BP (!) 141/95   Pulse 67   Ht 6' 2 (1.88 m)   Wt 225 lb  6.4 oz (102.2 kg)   SpO2 98%   BMI 28.94 kg/m   Physical Exam Vitals and nursing note reviewed.  Constitutional:      General: He is not in acute distress. Cardiovascular:     Rate and Rhythm: Normal rate and regular rhythm.  Pulmonary:     Effort: Pulmonary effort is normal.     Breath sounds: Normal breath sounds.  Abdominal:     Palpations: Abdomen is soft.     Tenderness: There is no abdominal tenderness.  Neurological:     General: No focal deficit present.     Mental Status: He is alert and oriented to person, place, and time.         Assessment & Plan:   Essential hypertension  Other orders -     hydroCHLOROthiazide ; Take 1 capsule (12.5 mg total) by mouth daily.  Dispense: 90 capsule; Refill: 0   Readings remain slightly elevated. Will prescribe hydrochlorothiazide  12.5 mg daily.   Return in about 3 months (around 03/12/2025) for follow up, chronic med issues.   Tanda Raguel SQUIBB, MD  "

## 2024-12-15 ENCOUNTER — Encounter: Payer: Self-pay | Admitting: Family Medicine

## 2025-03-12 ENCOUNTER — Ambulatory Visit: Payer: Self-pay | Admitting: Family Medicine
